# Patient Record
Sex: Male | Born: 1975
Health system: Southern US, Community
[De-identification: ages and names within clinical notes are randomized; demographics above are authoritative.]

---

## 2013-05-22 ENCOUNTER — Emergency Department: Payer: Self-pay | Admitting: Emergency Medicine

## 2013-09-07 ENCOUNTER — Emergency Department: Payer: Self-pay | Admitting: Emergency Medicine

## 2013-10-05 ENCOUNTER — Emergency Department: Payer: Self-pay | Admitting: Emergency Medicine

## 2013-10-13 ENCOUNTER — Ambulatory Visit: Payer: Self-pay | Admitting: Orthopedic Surgery

## 2013-10-19 ENCOUNTER — Emergency Department: Payer: Self-pay | Admitting: Emergency Medicine

## 2013-10-19 LAB — CBC WITH DIFFERENTIAL/PLATELET
BASOS PCT: 0.9 %
Basophil #: 0.1 10*3/uL (ref 0.0–0.1)
EOS PCT: 1.2 %
Eosinophil #: 0.1 10*3/uL (ref 0.0–0.7)
HCT: 46.9 % (ref 40.0–52.0)
HGB: 15.8 g/dL (ref 13.0–18.0)
LYMPHS PCT: 23.6 %
Lymphocyte #: 1.8 10*3/uL (ref 1.0–3.6)
MCH: 30.4 pg (ref 26.0–34.0)
MCHC: 33.6 g/dL (ref 32.0–36.0)
MCV: 90 fL (ref 80–100)
Monocyte #: 0.4 x10 3/mm (ref 0.2–1.0)
Monocyte %: 5.3 %
NEUTROS ABS: 5.2 10*3/uL (ref 1.4–6.5)
NEUTROS PCT: 69 %
PLATELETS: 233 10*3/uL (ref 150–440)
RBC: 5.19 10*6/uL (ref 4.40–5.90)
RDW: 13.3 % (ref 11.5–14.5)
WBC: 7.6 10*3/uL (ref 3.8–10.6)

## 2013-10-19 LAB — BASIC METABOLIC PANEL
Anion Gap: 7 (ref 7–16)
BUN: 13 mg/dL (ref 7–18)
CALCIUM: 9.5 mg/dL (ref 8.5–10.1)
CO2: 23 mmol/L (ref 21–32)
CREATININE: 0.73 mg/dL (ref 0.60–1.30)
Chloride: 107 mmol/L (ref 98–107)
EGFR (African American): >60
EGFR (Non-African Amer.): >60
GLUCOSE: 95 mg/dL (ref 65–99)
Osmolality: 274 (ref 275–301)
POTASSIUM: 3.7 mmol/L (ref 3.5–5.1)
SODIUM: 137 mmol/L (ref 136–145)

## 2013-10-19 LAB — TROPONIN I

## 2013-10-30 ENCOUNTER — Ambulatory Visit: Payer: Self-pay | Admitting: Unknown Physician Specialty

## 2013-11-27 DIAGNOSIS — Z9889 Other specified postprocedural states: Secondary | ICD-10-CM | POA: Insufficient documentation

## 2013-12-25 ENCOUNTER — Ambulatory Visit: Payer: Self-pay | Admitting: Unknown Physician Specialty

## 2014-01-08 ENCOUNTER — Ambulatory Visit: Payer: Self-pay | Admitting: Unknown Physician Specialty

## 2014-01-14 DIAGNOSIS — Z9889 Other specified postprocedural states: Secondary | ICD-10-CM | POA: Insufficient documentation

## 2014-04-06 DIAGNOSIS — M659 Synovitis and tenosynovitis, unspecified: Secondary | ICD-10-CM | POA: Insufficient documentation

## 2014-05-26 ENCOUNTER — Emergency Department: Payer: Self-pay | Admitting: Emergency Medicine

## 2014-07-15 ENCOUNTER — Emergency Department: Payer: Self-pay | Admitting: Emergency Medicine

## 2014-07-29 DIAGNOSIS — F1120 Opioid dependence, uncomplicated: Secondary | ICD-10-CM | POA: Insufficient documentation

## 2014-08-26 DIAGNOSIS — M25561 Pain in right knee: Secondary | ICD-10-CM | POA: Insufficient documentation

## 2014-11-10 NOTE — Discharge Instructions (Signed)

## 2014-11-12 ENCOUNTER — Ambulatory Visit: Admission: RE | Admit: 2014-11-12 | Payer: Self-pay | Source: Ambulatory Visit | Admitting: Unknown Physician Specialty

## 2014-11-12 ENCOUNTER — Encounter: Admission: RE | Payer: Self-pay | Source: Ambulatory Visit

## 2014-11-12 SURGERY — ARTHROSCOPY, KNEE
Anesthesia: Choice | Laterality: Right

## 2014-12-24 ENCOUNTER — Encounter: Admission: RE | Payer: Self-pay | Source: Ambulatory Visit

## 2014-12-24 ENCOUNTER — Ambulatory Visit: Admission: RE | Admit: 2014-12-24 | Payer: Self-pay | Source: Ambulatory Visit | Admitting: Unknown Physician Specialty

## 2014-12-24 SURGERY — ARTHROSCOPY, KNEE
Anesthesia: Choice | Laterality: Right

## 2016-03-16 ENCOUNTER — Emergency Department: Payer: Self-pay

## 2016-03-16 ENCOUNTER — Emergency Department
Admission: EM | Admit: 2016-03-16 | Discharge: 2016-03-16 | Disposition: A | Discharge status: Home / Self Care | Payer: Self-pay | Attending: Student in an Organized Health Care Education/Training Program | Admitting: Student in an Organized Health Care Education/Training Program

## 2016-03-16 ENCOUNTER — Encounter: Payer: Self-pay | Admitting: Emergency Medicine

## 2016-03-16 DIAGNOSIS — N281 Cyst of kidney, acquired: Secondary | ICD-10-CM | POA: Insufficient documentation

## 2016-03-16 DIAGNOSIS — F17200 Nicotine dependence, unspecified, uncomplicated: Secondary | ICD-10-CM | POA: Insufficient documentation

## 2016-03-16 LAB — URINALYSIS COMPLETE WITH MICROSCOPIC (ARMC ONLY)
BACTERIA UA: NONE SEEN
Bilirubin Urine: NEGATIVE
Glucose, UA: NEGATIVE mg/dL
Ketones, ur: NEGATIVE mg/dL
Leukocytes, UA: NEGATIVE
Nitrite: NEGATIVE
PROTEIN: NEGATIVE mg/dL
Specific Gravity, Urine: 1.017 (ref 1.005–1.030)
WBC UA: NONE SEEN WBC/hpf (ref 0–5)
pH: 7 (ref 5.0–8.0)

## 2016-03-16 MED ORDER — HYDROCODONE-ACETAMINOPHEN 5-325 MG PO TABS: 1.0000 | ORAL_TABLET | ORAL | 0 refills | Status: DC | PRN

## 2016-03-16 MED ORDER — SULFAMETHOXAZOLE-TRIMETHOPRIM 800-160 MG PO TABS: 1.0000 | ORAL_TABLET | Freq: Two times a day (BID) | ORAL | 0 refills | Status: DC

## 2016-03-16 MED ORDER — IBUPROFEN 800 MG PO TABS: 800.0000 mg | ORAL_TABLET | Freq: Three times a day (TID) | ORAL | 0 refills | Status: DC | PRN

## 2016-03-16 NOTE — ED Provider Notes (Signed)
[facility] Emergency Department Provider Note  ____________________________________________  Time seen: Approximately 8:27 AM  I have reviewed the triage vital signs and the nursing notes.   HISTORY  Chief Complaint Dysuria    HPI Gabriel Bullock is a 40 y.o. male presents for evaluation of burning pain upon urination. She reports episodes started prior to arrival earlier this morning. Complains of severe burning. Denies any discharge at all. Denies new sexual partners.   History reviewed. No pertinent past medical history.  There are no active problems to display for this patient.   History reviewed. No pertinent surgical history.  Prior to Admission medications   Medication Sig Start Date End Date Taking? Authorizing Provider  HYDROcodone-acetaminophen (NORCO) 5-325 MG tablet Take 1-2 tablets by mouth every 4 (four) hours as needed for moderate pain. 03/16/16   Pierce Crane Audreyana Huntsberry, PA-C  ibuprofen (ADVIL,MOTRIN) 800 MG tablet Take 1 tablet (800 mg total) by mouth every 8 (eight) hours as needed. 03/16/16   Pierce Crane Jariyah Hackley, PA-C  sulfamethoxazole-trimethoprim (BACTRIM DS,SEPTRA DS) 800-160 MG tablet Take 1 tablet by mouth 2 (two) times daily. 03/16/16   Arlyss Repress, PA-C    Allergies Review of patient's allergies indicates no known allergies.  No family history on file.  Social History Social History  Substance Use Topics  . Smoking status: Current Every Day Smoker  . Smokeless tobacco: Never Used  . Alcohol use No    Review of Systems Constitutional: No fever/chills Cardiovascular: Denies chest pain. Respiratory: Denies shortness of breath. Gastrointestinal: No abdominal pain.  No nausea, no vomiting.  No diarrhea.  No constipation. Genitourinary: Positive for dysuria Musculoskeletal: Negative for back pain. Skin: Negative for rash. Neurological: Negative for headaches, focal weakness or numbness.  10-point ROS otherwise  negative.  ____________________________________________   PHYSICAL EXAM:  VITAL SIGNS: ED Triage Vitals [03/16/16 0815]  Enc Vitals Group     BP (!) 146/90     Pulse Rate 71     Resp 18     Temp 97.9 F (36.6 C)     Temp Source Oral     SpO2 99 %     Weight 180 lb (81.6 kg)     Height 6\' 3"  (1.905 m)     Head Circumference      Peak Flow      Pain Score 10     Pain Loc      Pain Edu?      Excl. in [place]?     Constitutional: Alert and oriented. Well appearing and in no acute distress. Gastrointestinal: Soft and nontender. No distention.  No CVA tenderness. Musculoskeletal: No lower extremity tenderness nor edema.  No joint effusions. Neurologic:  Normal speech and language. No gross focal neurologic deficits are appreciated. No gait instability. Skin:  Skin is warm, dry and intact. No rash noted. Psychiatric: Mood and affect are normal. Speech and behavior are normal.  ____________________________________________   LABS (all labs ordered are listed, but only abnormal results are displayed)  Labs Reviewed  URINALYSIS COMPLETEWITH MICROSCOPIC ([place]) - Abnormal; Notable for the following:       Result Value   Color, Urine YELLOW (*)    APPearance HAZY (*)    Hgb urine dipstick 3+ (*)    Squamous Epithelial / LPF 0-5 (*)    All other components within normal limits   ____________________________________________   PROCEDURES  Procedure(s) performed: None   IMPRESSION:  Disc herniation a tLEFT L5-S1 questionably exerting mass  effect on  the LEFT L5 and S1 nerve roots, correlation for symptoms in these  distributions recommended ; if further assessment is required  recommend MR imaging of the lumbar spine.    BILATERAL renal cysts, with 2 cysts in the LEFT kidney complicated  by wall calcifications and septations, incompletely characterized;  followup MR imaging with and without contrast recommended to  characterize these complicated renal cysts.    No  acute intra-abdominal or intrapelvic abnormalities otherwise  identified.    Critical Care performed: No  ____________________________________________   INITIAL IMPRESSION / ASSESSMENT AND PLAN / ED COURSE  Pertinent labs & imaging results that were available during my care of the patient were reviewed by me and considered in my medical decision making (see chart for details). Review of the [place] CSRS was performed in accordance of the [place] prior to dispensing any controlled drugs.  Renal cyst. Rx given for Bactrim DS twice a day, ibuprofen 800 mg 3 times a day and encouraged the use of Tylenol over-the-counter. Patient was also referred to urology for follow-up.  Clinical Course    ____________________________________________   FINAL CLINICAL IMPRESSION(S) / ED DIAGNOSES  Final diagnoses:  Renal cyst     This chart was dictated using voice recognition software/Dragon. Despite best efforts to proofread, errors can occur which can change the meaning. Any change was purely unintentional.    Arlyss Repress, PA-C 03/16/16 0945    Merlyn Lot, MD 03/16/16 [phone]

## 2016-03-16 NOTE — ED Notes (Signed)
Patient reports waking up at 3am to urinate having pain and burning with urination. Pt states the urine had a dark color to it. Denies any other sxs at this time. Denies any penile discharge as well.  Pt states he has never had a urinary tract infection before.

## 2016-03-16 NOTE — ED Triage Notes (Signed)
Pt presents with urinary burning and pain started this am.

## 2016-03-19 ENCOUNTER — Ambulatory Visit (INDEPENDENT_AMBULATORY_CARE_PROVIDER_SITE_OTHER): Payer: Self-pay | Admitting: Urology

## 2016-03-19 ENCOUNTER — Encounter: Payer: Self-pay | Admitting: Urology

## 2016-03-19 DIAGNOSIS — N281 Cyst of kidney, acquired: Secondary | ICD-10-CM

## 2016-03-19 DIAGNOSIS — D4112 Neoplasm of uncertain behavior of left renal pelvis: Secondary | ICD-10-CM

## 2016-03-19 DIAGNOSIS — R310 Gross hematuria: Secondary | ICD-10-CM

## 2016-03-19 DIAGNOSIS — Q6100 Congenital renal cyst, unspecified: Secondary | ICD-10-CM

## 2016-03-19 LAB — MICROSCOPIC EXAMINATION: RBC, UA: >30 /hpf — AB (ref 0–?)

## 2016-03-19 LAB — URINALYSIS, COMPLETE
Bilirubin, UA: NEGATIVE
GLUCOSE, UA: NEGATIVE
KETONES UA: NEGATIVE
LEUKOCYTES UA: NEGATIVE
NITRITE UA: NEGATIVE
Urobilinogen, Ur: 0.2 mg/dL (ref 0.2–1.0)
pH, UA: 5.5 (ref 5.0–7.5)

## 2016-03-19 NOTE — Progress Notes (Signed)
03/19/2016 6:26 AM   Gabriel Bullock [birth year] [id number]  Referring provider: No referring provider defined for this encounter.  No chief complaint on file.   HPI:  1 - Gross Hematuria - new gross hematuria 03/2016. 20PY smoker.  CT stone with bilateral renal cysts and left mass as per below. Cr 0.73 by ER labs 03/2016.   2 - Left Renal Cystic Neoplasms - Left upper pole 6cm cystic mass and lower pole 4cm cystic masses with wall calcificaitons and nodules by CT stone. This si in background of less complex bilateral cysts as per below.  3 - Bilateral Renal Cysts - Rt upper 3cm, Rt lower 3.8cm, Rt mid 1cm (all non-complex) and Left mid periplevic (non-complex) plus two more left complex.   PMH sig for spine disc disease, bilateral knee scope. No CV disease / blood thinners. He is single father his wife having died recently around 02/11/16.   Today "Gabriel Bullock" is seen as new patient for above. He is referred by ER.     PMH: No past medical history on file.  Surgical History: No past surgical history on file.  Home Medications:    Medication List       Accurate as of 03/19/16  6:26 AM. Always use your most recent med list.          HYDROcodone-acetaminophen 5-325 MG tablet Commonly known as:  NORCO Take 1-2 tablets by mouth every 4 (four) hours as needed for moderate pain.   ibuprofen 800 MG tablet Commonly known as:  ADVIL,MOTRIN Take 1 tablet (800 mg total) by mouth every 8 (eight) hours as needed.   sulfamethoxazole-trimethoprim 800-160 MG tablet Commonly known as:  BACTRIM DS,SEPTRA DS Take 1 tablet by mouth 2 (two) times daily.       Allergies: No Known Allergies  Family History: No family history on file.  Social History:  reports that he has been smoking.  He has never used smokeless tobacco. He reports that he does not drink alcohol. His drug history is not on file.    Review of Systems  Gastrointestinal (upper)  : Negative for upper GI  symptoms  Gastrointestinal (lower) : Negative for lower GI symptoms  Constitutional : Negative for symptoms  Skin: Negative for skin symptoms  Eyes: Negative for eye symptoms  Ear/Nose/Throat : Negative for Ear/Nose/Throat symptoms  Hematologic/Lymphatic: Negative for Hematologic/Lymphatic symptoms  Cardiovascular : Negative for cardiovascular symptoms  Respiratory : Negative for respiratory symptoms  Endocrine: Negative for endocrine symptoms  Musculoskeletal: Negative for musculoskeletal symptoms  Neurological: Negative for neurological symptoms  Psychologic: Negative for psychiatric symptoms   Physical Exam: There were no vitals taken for this visit.  Constitutional:  Alert and oriented, No acute distress. HEENT: [place] AT, moist mucus membranes.  Trachea midline, no masses. Cardiovascular: No clubbing, cyanosis, or edema. Respiratory: Normal respiratory effort, no increased work of breathing. GI: Abdomen is soft, nontender, nondistended, no abdominal masses GU: No CVA tenderness. Strraght phallus with testes down w/o masses. Circ'd.  Skin: No rashes, bruises or suspicious lesions. Lymph: No cervical or inguinal adenopathy. Neurologic: Grossly intact, no focal deficits, moving all 4 extremities. Psychiatric: Normal mood and affect.  Laboratory Data: Lab Results  Component Value Date   WBC 7.6 10/19/2013   HGB 15.8 10/19/2013   HCT 46.9 10/19/2013   MCV 90 10/19/2013   PLT 233 10/19/2013    Lab Results  Component Value Date   CREATININE 0.73 10/19/2013     Urinalysis    Component  Value Date/Time   COLORURINE YELLOW (A) 03/16/2016 0832   APPEARANCEUR HAZY (A) 03/16/2016 0832   LABSPEC 1.017 03/16/2016 0832   PHURINE 7.0 03/16/2016 0832   GLUCOSEU NEGATIVE 03/16/2016 0832   HGBUR 3+ (A) 03/16/2016 0832   BILIRUBINUR NEGATIVE 03/16/2016 0832   KETONESUR NEGATIVE 03/16/2016 0832   PROTEINUR NEGATIVE 03/16/2016 0832   NITRITE NEGATIVE  03/16/2016 0832   LEUKOCYTESUR NEGATIVE 03/16/2016 0832    Pertinent Imaging: As per above  Assessment & Plan:    1 - Gross Hematuria - MR Urogram and cysto on return to complete eval. DDX benign and malignant etiologies discssed. Left masses quite concernign as per below.   2 - Left Renal Cystic Neoplasms - Unusual at his age, but concerning. Rec MR Urogram (with and without contrast abd-pelvis) to further characterize. Possibility of malignancy discussed.   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 Fortunatley GFR is excellent. Imaging as per above and will need at least yearly surveillance with imaging and GFR measure even in imaging lowers suspiciosn for cancer. May also consider future nephrology eval especially if any sig GFR decline.   4 - RTC with MRI for cysto.      Alexis Frock, [facility] [address], [place], [place] [address] [phone]

## 2016-04-12 ENCOUNTER — Ambulatory Visit
Admission: RE | Admit: 2016-04-12 | Discharge: 2016-04-12 | Disposition: A | Discharge status: Home / Self Care | Payer: Self-pay | Source: Ambulatory Visit | Attending: Urology | Admitting: Urology

## 2016-04-12 DIAGNOSIS — N281 Cyst of kidney, acquired: Secondary | ICD-10-CM | POA: Insufficient documentation

## 2016-04-12 DIAGNOSIS — N289 Disorder of kidney and ureter, unspecified: Secondary | ICD-10-CM | POA: Insufficient documentation

## 2016-04-12 DIAGNOSIS — R310 Gross hematuria: Secondary | ICD-10-CM | POA: Insufficient documentation

## 2016-04-12 DIAGNOSIS — D4112 Neoplasm of uncertain behavior of left renal pelvis: Secondary | ICD-10-CM

## 2016-04-12 MED ORDER — GADOBENATE DIMEGLUMINE 529 MG/ML IV SOLN
20.0000 mL | Freq: Once | INTRAVENOUS | Status: AC | PRN
  Administered 2016-04-12: 16 mL via INTRAVENOUS

## 2016-04-17 ENCOUNTER — Other Ambulatory Visit: Payer: Self-pay | Admitting: Urology

## 2016-04-17 ENCOUNTER — Encounter: Payer: Self-pay | Admitting: Urology

## 2016-05-15 ENCOUNTER — Other Ambulatory Visit: Payer: Self-pay | Admitting: Urology

## 2016-05-15 NOTE — Progress Notes (Deleted)
05/15/2016 12:54 PM   Gabriel Bullock [birth year] [id number]  Referring provider: No referring provider defined for this encounter.  No chief complaint on file.   HPI:  1 - Gross Hematuria - new gross hematuria 03/2016. 20PY smoker.  CT stone with bilateral renal cysts and left mass as per below. Dedicated MRI abd 2017 w/o additional findings. Cr 0.73 by ER labs 03/2016. Cysto 05/2016 XXXX.Marland Kitchen   2 - Left Renal Cystic Neoplasms - Left upper pole 6cm cystic mass and lower pole 4cm cystic masses with wall calcificaitons and nodules by CT stone. This si in background of less complex bilateral cysts as per below. Dedicated MRI 05/2016 [place] 5F as some septations, but no nodular enhancement.   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 GFR excellent.   PMH sig for spine disc disease, bilateral knee scope. No CV disease / blood thinners. He is single father his wife having died recently around March 26, 2016.   Today "Gabriel Bullock" is seen for cysto and f/u above to complete hematuria eval. Interval MRI very favorable w/o worry for obvious upper tract tumors.     PMH: No past medical history on file.  Surgical History: No past surgical history on file.  Home Medications:    Medication List       Accurate as of 05/15/16 12:54 PM. Always use your most recent med list.          HYDROcodone-acetaminophen 5-325 MG tablet Commonly known as:  NORCO Take 1-2 tablets by mouth every 4 (four) hours as needed for moderate pain.   ibuprofen 800 MG tablet Commonly known as:  ADVIL,MOTRIN Take 1 tablet (800 mg total) by mouth every 8 (eight) hours as needed.   sulfamethoxazole-trimethoprim 800-160 MG tablet Commonly known as:  BACTRIM DS,SEPTRA DS Take 1 tablet by mouth 2 (two) times daily.       Allergies: No Known Allergies  Family History: No family  history on file.  Social History:  reports that he has been smoking.  He has been smoking about 0.75 packs per day. He has never used smokeless tobacco. He reports that he does not drink alcohol or use drugs.    Review of Systems  Gastrointestinal (upper)  : Negative for upper GI symptoms  Gastrointestinal (lower) : Negative for lower GI symptoms  Constitutional : Negative for symptoms  Skin: Negative for skin symptoms  Eyes: Negative for eye symptoms  Ear/Nose/Throat : Negative for Ear/Nose/Throat symptoms  Hematologic/Lymphatic: Negative for Hematologic/Lymphatic symptoms  Cardiovascular : Negative for cardiovascular symptoms  Respiratory : Negative for respiratory symptoms  Endocrine: Negative for endocrine symptoms  Musculoskeletal: Negative for musculoskeletal symptoms  Neurological: Negative for neurological symptoms  Psychologic: Negative for psychiatric symptoms   Physical Exam: There were no vitals taken for this visit.  Constitutional:  Alert and oriented, No acute distress. HEENT: [place] AT, moist mucus membranes.  Trachea midline, no masses. Cardiovascular: No clubbing, cyanosis, or edema. Respiratory: Normal respiratory effort, no increased work of breathing. GI: Abdomen is soft, nontender, nondistended, no abdominal masses GU: No CVA tenderness. Strraght phallus with testes down w/o masses. Circ'd.  Skin: No rashes, bruises or suspicious lesions. Lymph: No cervical or inguinal adenopathy. Neurologic: Grossly intact, no focal deficits, moving all 4 extremities. Psychiatric: Normal mood and affect.  Laboratory Data: Lab Results  Component Value Date   WBC  7.6 10/19/2013   HGB 15.8 10/19/2013   HCT 46.9 10/19/2013   MCV 90 10/19/2013   PLT 233 10/19/2013    Lab Results  Component Value Date   CREATININE 0.73 10/19/2013     Urinalysis    Component Value Date/Time   COLORURINE YELLOW (A) 03/16/2016 0832   APPEARANCEUR Cloudy (A)  03/19/2016 0859   LABSPEC 1.017 03/16/2016 0832   PHURINE 7.0 03/16/2016 0832   GLUCOSEU Negative 03/19/2016 0859   HGBUR 3+ (A) 03/16/2016 0832   BILIRUBINUR Negative 03/19/2016 0859   KETONESUR NEGATIVE 03/16/2016 0832   PROTEINUR Trace (A) 03/19/2016 0859   PROTEINUR NEGATIVE 03/16/2016 0832   NITRITE Negative 03/19/2016 0859   NITRITE NEGATIVE 03/16/2016 0832   LEUKOCYTESUR Negative 03/19/2016 0859    Pertinent Imaging: As per above  Assessment & Plan:    1 - Gross Hematuria - eval with labs, exam, imaging, cysto w/o worrisome etiologies. This is good news. Will surveil annually x several given mildly complex cysts.   2 - Left Renal Cystic Neoplasms - MRI favorable, but does require surveillance. Rec alternate CT, MRI, [place] going forward x several years to verify stability given young age.   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 Fortunatley GFR is excellent. Imaging as per above and will need at least yearly surveillance with imaging and GFR measure even in imaging lowers suspiciosn for cancer. May also consider future nephrology eval especially if any sig GFR decline.   4 - RTC 1 year with BMP and CT renal with/without.     Alexis Frock, [facility] [address], [place], [place] [address] [phone]

## 2016-05-28 ENCOUNTER — Inpatient Hospital Stay: Payer: Self-pay | Admitting: Anesthesiology

## 2016-05-28 ENCOUNTER — Emergency Department
Admission: EM | Admit: 2016-05-28 | Discharge: 2016-05-28 | Discharge status: Against Medical Advice | Payer: Self-pay | Attending: Emergency Medicine | Admitting: Emergency Medicine

## 2016-05-28 ENCOUNTER — Emergency Department: Payer: Self-pay

## 2016-05-28 ENCOUNTER — Ambulatory Visit: Admit: 2016-05-28 | Payer: Self-pay | Admitting: Surgery

## 2016-05-28 ENCOUNTER — Encounter
Admission: EM | Disposition: A | Discharge status: Home / Self Care | Payer: Self-pay | Source: Home / Self Care | Attending: Surgery

## 2016-05-28 ENCOUNTER — Inpatient Hospital Stay
Admission: EM | Admit: 2016-05-28 | Discharge: 2016-05-30 | DRG: 603 | Disposition: A | Discharge status: Home / Self Care | Payer: Self-pay | Attending: Surgery | Admitting: Surgery

## 2016-05-28 DIAGNOSIS — F17210 Nicotine dependence, cigarettes, uncomplicated: Secondary | ICD-10-CM | POA: Diagnosis present

## 2016-05-28 DIAGNOSIS — Z23 Encounter for immunization: Secondary | ICD-10-CM

## 2016-05-28 DIAGNOSIS — L03113 Cellulitis of right upper limb: Secondary | ICD-10-CM | POA: Diagnosis present

## 2016-05-28 DIAGNOSIS — I808 Phlebitis and thrombophlebitis of other sites: Secondary | ICD-10-CM | POA: Diagnosis present

## 2016-05-28 DIAGNOSIS — L02419 Cutaneous abscess of limb, unspecified: Secondary | ICD-10-CM

## 2016-05-28 DIAGNOSIS — L0291 Cutaneous abscess, unspecified: Secondary | ICD-10-CM

## 2016-05-28 DIAGNOSIS — L02413 Cutaneous abscess of right upper limb: Secondary | ICD-10-CM | POA: Insufficient documentation

## 2016-05-28 DIAGNOSIS — F17200 Nicotine dependence, unspecified, uncomplicated: Secondary | ICD-10-CM | POA: Insufficient documentation

## 2016-05-28 HISTORY — PX: INCISION AND DRAINAGE ABSCESS: SHX5864

## 2016-05-28 LAB — CBC WITH DIFFERENTIAL/PLATELET
BASOS ABS: 0.1 10*3/uL (ref 0–0.1)
Basophils Relative: 1 %
EOS ABS: 0.1 10*3/uL (ref 0–0.7)
EOS PCT: 1 %
HCT: 46.1 % (ref 40.0–52.0)
HEMOGLOBIN: 15.8 g/dL (ref 13.0–18.0)
LYMPHS ABS: 3.6 10*3/uL (ref 1.0–3.6)
LYMPHS PCT: 25 %
MCH: 30.5 pg (ref 26.0–34.0)
MCHC: 34.3 g/dL (ref 32.0–36.0)
MCV: 88.7 fL (ref 80.0–100.0)
Monocytes Absolute: 1.1 10*3/uL — ABNORMAL HIGH (ref 0.2–1.0)
Monocytes Relative: 8 %
NEUTROS PCT: 65 %
Neutro Abs: 9.6 10*3/uL — ABNORMAL HIGH (ref 1.4–6.5)
PLATELETS: 217 10*3/uL (ref 150–440)
RBC: 5.19 MIL/uL (ref 4.40–5.90)
RDW: 13.2 % (ref 11.5–14.5)
WBC: 14.5 10*3/uL — AB (ref 3.8–10.6)

## 2016-05-28 LAB — BASIC METABOLIC PANEL
Anion gap: 9 (ref 5–15)
BUN: 14 mg/dL (ref 6–20)
CHLORIDE: 98 mmol/L — AB (ref 101–111)
CO2: 28 mmol/L (ref 22–32)
CREATININE: 0.79 mg/dL (ref 0.61–1.24)
Calcium: 9.3 mg/dL (ref 8.9–10.3)
Glucose, Bld: 90 mg/dL (ref 65–99)
POTASSIUM: 3.9 mmol/L (ref 3.5–5.1)
SODIUM: 135 mmol/L (ref 135–145)

## 2016-05-28 LAB — LACTIC ACID, PLASMA: LACTIC ACID, VENOUS: 1.1 mmol/L (ref 0.5–1.9)

## 2016-05-28 SURGERY — INCISION AND DRAINAGE, ABSCESS
Anesthesia: General | Site: Arm Upper | Laterality: Right | Wound class: Dirty or Infected

## 2016-05-28 MED ORDER — VANCOMYCIN HCL IN DEXTROSE 1-5 GM/200ML-% IV SOLN
1000.0000 mg | Freq: Once | INTRAVENOUS | Status: AC
  Administered 2016-05-28: 1000 mg via INTRAVENOUS
  Filled 2016-05-28: qty 200

## 2016-05-28 MED ORDER — FENTANYL CITRATE (PF) 100 MCG/2ML IJ SOLN
INTRAMUSCULAR | Status: AC
  Administered 2016-05-28: 25 ug via INTRAVENOUS
  Filled 2016-05-28: qty 2

## 2016-05-28 MED ORDER — KETOROLAC TROMETHAMINE 30 MG/ML IJ SOLN
30.0000 mg | Freq: Once | INTRAMUSCULAR | Status: AC
  Administered 2016-05-28: 30 mg via INTRAVENOUS
  Filled 2016-05-28: qty 1

## 2016-05-28 MED ORDER — OXYCODONE-ACETAMINOPHEN 5-325 MG PO TABS
1.0000 | ORAL_TABLET | ORAL | Status: DC | PRN
  Filled 2016-05-28: qty 1

## 2016-05-28 MED ORDER — PIPERACILLIN-TAZOBACTAM 3.375 G IVPB 30 MIN
3.3750 g | Freq: Once | INTRAVENOUS | Status: AC
  Administered 2016-05-28: 3.375 g via INTRAVENOUS
  Filled 2016-05-28: qty 50

## 2016-05-28 MED ORDER — OXYCODONE HCL 5 MG PO TABS
5.0000 mg | ORAL_TABLET | ORAL | Status: DC | PRN
  Administered 2016-05-29 (×2): 5 mg via ORAL
  Administered 2016-05-30 (×2): 10 mg via ORAL
  Filled 2016-05-28: qty 1
  Filled 2016-05-28: qty 2
  Filled 2016-05-28: qty 1
  Filled 2016-05-28: qty 2

## 2016-05-28 MED ORDER — VANCOMYCIN HCL IN DEXTROSE 1-5 GM/200ML-% IV SOLN
1000.0000 mg | Freq: Three times a day (TID) | INTRAVENOUS | Status: DC
  Administered 2016-05-29 – 2016-05-30 (×5): 1000 mg via INTRAVENOUS
  Filled 2016-05-28 (×7): qty 200

## 2016-05-28 MED ORDER — ACETAMINOPHEN 500 MG PO TABS
1000.0000 mg | ORAL_TABLET | Freq: Four times a day (QID) | ORAL | Status: DC
  Administered 2016-05-28 – 2016-05-30 (×7): 1000 mg via ORAL
  Filled 2016-05-28 (×7): qty 2

## 2016-05-28 MED ORDER — INFLUENZA VAC SPLIT QUAD 0.5 ML IM SUSY
0.5000 mL | PREFILLED_SYRINGE | INTRAMUSCULAR | Status: AC
  Administered 2016-05-29: 0.5 mL via INTRAMUSCULAR
  Filled 2016-05-28: qty 0.5

## 2016-05-28 MED ORDER — ONDANSETRON HCL 4 MG/2ML IJ SOLN: 4.0000 mg | Freq: Four times a day (QID) | INTRAMUSCULAR | Status: DC | PRN

## 2016-05-28 MED ORDER — ONDANSETRON 4 MG PO TBDP: 4.0000 mg | ORAL_TABLET | Freq: Four times a day (QID) | ORAL | Status: DC | PRN

## 2016-05-28 MED ORDER — SUCCINYLCHOLINE CHLORIDE 20 MG/ML IJ SOLN
INTRAMUSCULAR | Status: DC | PRN
  Administered 2016-05-28: 100 mg via INTRAVENOUS

## 2016-05-28 MED ORDER — HYDROMORPHONE HCL 1 MG/ML IJ SOLN: 0.5000 mg | INTRAMUSCULAR | Status: DC | PRN

## 2016-05-28 MED ORDER — PROPOFOL 10 MG/ML IV BOLUS
INTRAVENOUS | Status: DC | PRN
  Administered 2016-05-28: 200 mg via INTRAVENOUS

## 2016-05-28 MED ORDER — FENTANYL CITRATE (PF) 100 MCG/2ML IJ SOLN
INTRAMUSCULAR | Status: DC | PRN
  Administered 2016-05-28: 100 ug via INTRAVENOUS

## 2016-05-28 MED ORDER — MIDAZOLAM HCL 2 MG/2ML IJ SOLN
INTRAMUSCULAR | Status: DC | PRN
  Administered 2016-05-28: 2 mg via INTRAVENOUS

## 2016-05-28 MED ORDER — POLYETHYLENE GLYCOL 3350 17 G PO PACK: 17.0000 g | PACK | Freq: Every day | ORAL | Status: DC | PRN

## 2016-05-28 MED ORDER — LACTATED RINGERS IV SOLN
125.0000 mL/h | INTRAVENOUS | Status: DC
  Administered 2016-05-29 – 2016-05-30 (×4): 125 mL/h via INTRAVENOUS

## 2016-05-28 MED ORDER — LACTATED RINGERS IV SOLN
INTRAVENOUS | Status: DC | PRN
  Administered 2016-05-28: 21:00:00 via INTRAVENOUS

## 2016-05-28 MED ORDER — VANCOMYCIN HCL IN DEXTROSE 1-5 GM/200ML-% IV SOLN
1000.0000 mg | Freq: Once | INTRAVENOUS | Status: DC
  Filled 2016-05-28: qty 200

## 2016-05-28 MED ORDER — ENOXAPARIN SODIUM 40 MG/0.4ML ~~LOC~~ SOLN
40.0000 mg | SUBCUTANEOUS | Status: DC
  Administered 2016-05-29: 40 mg via SUBCUTANEOUS
  Filled 2016-05-28: qty 0.4

## 2016-05-28 MED ORDER — KETOROLAC TROMETHAMINE 30 MG/ML IJ SOLN
30.0000 mg | Freq: Four times a day (QID) | INTRAMUSCULAR | Status: DC
  Administered 2016-05-28 – 2016-05-30 (×7): 30 mg via INTRAVENOUS
  Filled 2016-05-28 (×7): qty 1

## 2016-05-28 MED ORDER — PIPERACILLIN-TAZOBACTAM 3.375 G IVPB
3.3750 g | Freq: Three times a day (TID) | INTRAVENOUS | Status: DC
  Administered 2016-05-29 – 2016-05-30 (×4): 3.375 g via INTRAVENOUS
  Filled 2016-05-28 (×4): qty 50

## 2016-05-28 MED ORDER — ONDANSETRON HCL 4 MG/2ML IJ SOLN: 4.0000 mg | Freq: Once | INTRAMUSCULAR | Status: DC | PRN

## 2016-05-28 MED ORDER — LIDOCAINE HCL (CARDIAC) 20 MG/ML IV SOLN
INTRAVENOUS | Status: DC | PRN
  Administered 2016-05-28: 50 mg via INTRAVENOUS

## 2016-05-28 MED ORDER — VANCOMYCIN HCL IN DEXTROSE 1-5 GM/200ML-% IV SOLN: 1000.0000 mg | Freq: Once | INTRAVENOUS | Status: DC

## 2016-05-28 MED ORDER — FENTANYL CITRATE (PF) 100 MCG/2ML IJ SOLN
25.0000 ug | INTRAMUSCULAR | Status: DC | PRN
  Administered 2016-05-28 (×2): 25 ug via INTRAVENOUS

## 2016-05-28 SURGICAL SUPPLY — 15 items
CANISTER SUCT 1200ML W/VALVE (MISCELLANEOUS) ×3 IMPLANT
DRAIN PENROSE 1/4X12 LTX (DRAIN) ×3 IMPLANT
DRAPE EXTREMITY 106X87X128.5 (DRAPES) ×3 IMPLANT
ELECT REM PT RETURN 9FT ADLT (ELECTROSURGICAL) ×3
ELECTRODE REM PT RTRN 9FT ADLT (ELECTROSURGICAL) ×1 IMPLANT
GLOVE BIO SURGEON STRL SZ7 (GLOVE) ×9 IMPLANT
GLOVE BIOGEL PI IND STRL 7.0 (GLOVE) ×2 IMPLANT
GLOVE BIOGEL PI INDICATOR 7.0 (GLOVE) ×4
GOWN STRL REUS W/ TWL LRG LVL3 (GOWN DISPOSABLE) ×2 IMPLANT
GOWN STRL REUS W/TWL LRG LVL3 (GOWN DISPOSABLE) ×4
KIT RM TURNOVER STRD PROC AR (KITS) ×3 IMPLANT
PACK BASIN MINOR ARMC (MISCELLANEOUS) ×3 IMPLANT
SUT ETHILON 3 0 PS 1 (SUTURE) ×3 IMPLANT
SWAB CULTURE AMIES ANAERIB BLU (MISCELLANEOUS) ×3 IMPLANT
SYRINGE 10CC LL (SYRINGE) ×3 IMPLANT

## 2016-05-28 NOTE — Progress Notes (Signed)
Pharmacy Antibiotic Note  Gabriel Bullock is a 40 y.o. male admitted on 05/28/2016 with abscess on right arm.  Pharmacy has been consulted for vancomycin and piperacillin/tazobactam dosing.  Plan: Piperacillin/tazobactam 3.375 g IV q8h EI  Vancomycin 1000 mg IV q8h (6 hour stacked dosing) Goal vancomycin trough 15-20 mcg/mL Vancomycin trough ordered for 11/29 at 0830  Kinetics: Using actual body weight = 79 kg Half-life: 7.9 hours Ke: 0.087 Vd: 55L Cmin ~14 mcg/mL estimated  Height: 6\' 3"  (190.5 cm) Weight: 175 lb (79.4 kg) IBW/kg (Calculated) : 84.5  Temp (24hrs), Avg:99.4 F (37.4 C), Min:98.4 F (36.9 C), Max:100.4 F (38 C)   Recent Labs Lab 05/28/16 1231  WBC 14.5*  CREATININE 0.79    Estimated Creatinine Clearance: 140.6 mL/min (by C-G formula based on SCr of 0.79 mg/dL).    No Known Allergies  Antimicrobials this admission: vancomycin 11/27 >>  Piperacillin/tazobactam 11/27 >>   Dose adjustments this admission:  Microbiology results: 11/27 BCx: Sent  Thank you for allowing pharmacy to be a part of this patient's care.  Lenis Noon, PharmD, BCPS Clinical Pharmacist 05/28/2016 7:13 PM

## 2016-05-28 NOTE — ED Provider Notes (Signed)
[facility] Emergency Department Provider Note  ____________________________________________  Time seen: Approximately 11:49 AM  I have reviewed the triage vital signs and the nursing notes.   HISTORY  Chief Complaint Arm Pain    HPI Gabriel Bullock is a 40 y.o. male, NAD, who presents to the emergency department for a 5 day history of right arm pain, redness and swelling. Patient reports that last Wednesday he went to the [facility] department for a mandatory blood draw. He reports that every ten years he has to have blood work done for charges against him. Since Wednesday he has pain that has worsened and is associated with progressive swelling and redness that began on Friday.He denies any trauma to the area aside from the blood draw and denies IV drug use. He reports he has not tried to open or cut into the area and denies any draining or bleeding. He denies fever, chills, abdominal pain, nausea, vomiting, numbness or tingling in the right upper extremity, cough, chest pain or shortness of breath.    No past medical history on file.  Patient Active Problem List   Diagnosis Date Noted  . Gross hematuria 03/19/2016  . 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 Renal cyst 03/19/2016    No past surgical history on file.  Prior to Admission medications   Medication Sig Start Date End Date Taking? Authorizing Provider  HYDROcodone-acetaminophen (NORCO) 5-325 MG tablet Take 1-2 tablets by mouth every 4 (four) hours as needed for moderate pain. Patient not taking: Reported on 03/19/2016 03/16/16   Pierce Crane Beers, PA-C  ibuprofen (ADVIL,MOTRIN) 800 MG tablet Take 1 tablet (800 mg total) by mouth every 8 (eight) hours as needed. 03/16/16   Pierce Crane Beers, PA-C  sulfamethoxazole-trimethoprim (BACTRIM DS,SEPTRA DS) 800-160 MG tablet Take 1 tablet by mouth 2 (two) times daily. 03/16/16   Arlyss Repress, PA-C    Allergies Patient has no known  allergies.  No family history on file.  Social History Social History  Substance Use Topics  . Smoking status: Current Every Day Smoker    Packs/day: 0.75  . Smokeless tobacco: Never Used  . Alcohol use No     Review of Systems  Constitutional: No fever/chills Cardiovascular: No chest pain. Respiratory: No shortness of breath. No wheezing.  Gastrointestinal: No abdominal pain.  No nausea, vomiting.   Musculoskeletal: Positive for pain at right [facility] fossa Skin: Positive for swelling and redness at the right AC fossa. Negative for rash, skin sores, bruising Neurological: No numbness, tingling, or loss of sensation to the right upper extremity.  10-point ROS otherwise negative.  ____________________________________________   PHYSICAL EXAM:  VITAL SIGNS: ED Triage Vitals  Enc Vitals Group     BP 05/28/16 1131 139/77     Pulse Rate 05/28/16 1131 95     Resp 05/28/16 1131 17     Temp 05/28/16 1131 98.4 F (36.9 C)     Temp Source 05/28/16 1131 Oral     SpO2 05/28/16 1131 98 %     Weight 05/28/16 1134 175 lb (79.4 kg)     Height 05/28/16 1134 6\' 3"  (1.905 m)     Head Circumference --      Peak Flow --      Pain Score 05/28/16 1135 9     Pain Loc --      Pain Edu? --      Excl. in [place]? --     Constitutional: Alert and  oriented. Well appearing and in no acute distress. Eyes: Conjunctivae are normal without icterus or injection.  Head: Atraumatic. Neck: Supple with full range of motion Hematological/Lymphatic/Immunilogical: No cervical lymphadenopathy. Cardiovascular: Normal rate, regular rhythm. Normal S1 and S2.  Good peripheral circulation with 2+ distal pulses in the right upper extremity. Capillary refill is brisk in all digits of the right hand Respiratory: Normal respiratory effort without tachypnea or retractions.  Musculoskeletal: Tenderness to palpation of the right upper extremity at [facility] fossa with induration. Range of motion decreased about the right elbow due to  pain. No tenderness to palpation about the humerus, olecranon or posterior forearm. Grip strength is 5 out of 5 and equal in bilateral hands. Neurologic:  Normal speech and language. No gross focal neurologic deficits are appreciated.  Skin:  Large baseball size area of cellulitis with underlying  induration to the right AC fossa surrounding erythema. Skin at area is warm to touch as compared to proximal or distal upper extremity. No streaking or oozing noted from the area. No entry wound visualized.  Psychiatric: Mood and affect are normal. Speech and behavior are normal. Patient exhibits appropriate insight and judgement.   ____________________________________________   LABS (all labs ordered are listed, but only abnormal results are displayed)  Labs Reviewed  BASIC METABOLIC PANEL - Abnormal; Notable for the following:       Result Value   Chloride 98 (*)    All other components within normal limits  CBC WITH DIFFERENTIAL/PLATELET - Abnormal; Notable for the following:    WBC 14.5 (*)    Neutro Abs 9.6 (*)    Monocytes Absolute 1.1 (*)    All other components within normal limits   ____________________________________________  EKG  None ____________________________________________  RADIOLOGY I, Judithe Modest Twylla Arceneaux, personally viewed and evaluated these images (plain radiographs) as part of my medical decision making, as well as reviewing the written report by the radiologist.  [place] Extrem Up Right [employer]  Result Date: 05/28/2016 CLINICAL DATA:  Pain, swelling and erythema in the antecubital fossa for 3 days. EXAM: ULTRASOUND right UPPER EXTREMITY LIMITED TECHNIQUE: Ultrasound examination of the upper extremity soft tissues was performed in the area of clinical concern. COMPARISON:  None FINDINGS: Complex fluid collection in the antecubital fossa measuring 3.8 x 1.6 cm is suspicious for an abscess. There is also diffuse cellulitis. Superficial thrombophlebitis noted in a branch of the  cephalic vein. IMPRESSION: Complex fluid collection in the antecubital fossa is suspicious for an abscess. Recommend MR imaging without and with contrast for further evaluation. Superficial thrombophlebitis. Diffuse cellulitis. Electronically Signed   By: Marijo Sanes M.D.   On: 05/28/2016 13:29    ____________________________________________    PROCEDURES  Procedure(s) performed: None   Procedures   Medications  vancomycin (VANCOCIN) IVPB 1000 mg/200 mL premix (1,000 mg Intravenous Not Given 05/28/16 1448)  ketorolac (TORADOL) 30 MG/ML injection 30 mg (30 mg Intravenous Given 05/28/16 1316)     ____________________________________________   INITIAL IMPRESSION / ASSESSMENT AND PLAN / ED COURSE  Pertinent labs & imaging results that were available during my care of the patient were reviewed by me and considered in my medical decision making (see chart for details).  Clinical Course as of May 28 1502  Mon May 28, 2016  1450 Patient notified nursing staff that he would be unable to stay any longer for IV antibiotics nor admission due to needing to make arrangements for child care. Patient notes he may return later tonight to complete evaluation and  treatment. Dr. Lavonia Drafts and Dr. Hampton Abbot has been notified of such. Dr. Hampton Abbot states that Dr. Clayburn Pert will be on call for general surgery after 7 PM and should be contacted if the patient shows up after that time.  [JH]    Clinical Course User Index [JH] Tallie Dodds L Kase Shughart, PA-C    Patient's diagnosis is consistent with abscess of the antecubital fossa. Please see above as patient left AMA.     ____________________________________________  FINAL CLINICAL IMPRESSION(S) / ED DIAGNOSES  Final diagnoses:  Abscess of antecubital fossa      NEW MEDICATIONS STARTED DURING THIS VISIT:  Discharge Medication List as of 05/28/2016  2:52 PM          Braxton Feathers, PA-C 05/28/16 1503    Lavonia Drafts, MD 05/28/16  [phone]

## 2016-05-28 NOTE — ED Provider Notes (Signed)
[facility] Emergency Department Provider Note   ____________________________________________    I have reviewed the triage vital signs and the nursing notes.   HISTORY  Chief Complaint Wound Infection     HPI Gabriel Bullock is a 40 y.o. male who was seen by me earlier today. He had to leave and pick up his daughter from school. He returns now for treatment. Continues to complain of throbbing  Moderate pain and swelling in right arm. He attributes this to [facility] [place] [facility]. Feels ill.    History reviewed. No pertinent past medical history.  Patient Active Problem List   Diagnosis Date Noted  . Gross hematuria 03/19/2016  . 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 Renal cyst 03/19/2016    History reviewed. No pertinent surgical history.  Prior to Admission medications   Not on File     Allergies Patient has no known allergies.  No family history on file.  Social History Social History  Substance Use Topics  . Smoking status: Current Every Day Smoker    Packs/day: 0.75  . Smokeless tobacco: Never Used  . Alcohol use No    Review of Systems  Constitutional: subjective fever Eyes: No visual changes.   Cardiovascular: Denies chest pain. Respiratory: Denies shortness of breath. Gastrointestinal: No abdominal pain.  No nausea, no vomiting.    Musculoskeletal:right arm pain as above Skin: rash to the right arm Neurological: Negative for headaches  10-point ROS otherwise negative.  ____________________________________________   PHYSICAL EXAM:  VITAL SIGNS: ED Triage Vitals [05/28/16 1732]  Enc Vitals Group     BP 133/82     Pulse Rate (!) 105     Resp 18     Temp (!) 100.4 F (38 C)     Temp Source Oral     SpO2 99 %     Weight 175 lb (79.4 kg)     Height 6\' 3"  (1.905 m)     Head Circumference      Peak Flow      Pain Score 8     Pain Loc      Pain Edu?      Excl. in  [place]?     Constitutional: Alert and oriented. No acute distress.  Eyes: Conjunctivae are normal.   Nose: No congestion/rhinnorhea. Mouth/Throat: Mucous membranes are moist.    Cardiovascular: Normal rate, regular rhythm. Grossly normal heart sounds.  Good peripheral circulation. Respiratory: Normal respiratory effort.  No retractions. Lungs CTAB. Gastrointestinal: Soft and nontender. No distention.  No CVA tenderness. Genitourinary: deferred Musculoskeletal: significant swelling and induration of right AC with surrounding cellulitis, no fluctuance to delineate specific location of abscess. 2+distal pulses Neurologic:  Normal speech and language. No gross focal neurologic deficits are appreciated.  Skin:  Skin is warm, dry and intact. As above Psychiatric: Mood and affect are normal. Speech and behavior are normal.  ____________________________________________   LABS (all labs ordered are listed, but only abnormal results are displayed)  Labs Reviewed  CULTURE, BLOOD (ROUTINE X 2)  CULTURE, BLOOD (ROUTINE X 2)  LACTIC ACID, PLASMA  LACTIC ACID, PLASMA   ____________________________________________  EKG   ____________________________________________  RADIOLOGY  US performed earlier today ____________________________________________   PROCEDURES  Procedure(s) performed: No    Critical Care performed: No ____________________________________________   INITIAL IMPRESSION / ASSESSMENT AND PLAN / ED COURSE  Pertinent labs & imaging results that were available during my care of the patient  were reviewed by me and considered in my medical decision making (see chart for details).  Contacted surgery for admission/drainage. Vanc/zosyn and sepsis protocol ordered.  Clinical Course    ____________________________________________   FINAL CLINICAL IMPRESSION(S) / ED DIAGNOSES  Final diagnoses:  Cellulitis of right upper extremity  Abscess      NEW MEDICATIONS  STARTED DURING THIS VISIT:  New Prescriptions   No medications on file     Note:  This document was prepared using Dragon voice recognition software and may include unintentional dictation errors.    Lavonia Drafts, MD 05/28/16 [phone]

## 2016-05-28 NOTE — ED Triage Notes (Signed)
Pt arrives with right sided arm pain  Pt reports that he had a DNA test last week and he has started experiencing pain at the site  Edema and redness noted to his right  [facility]

## 2016-05-28 NOTE — Anesthesia Procedure Notes (Signed)
Performed by: Jmari Pelc       

## 2016-05-28 NOTE — Anesthesia Procedure Notes (Signed)
Procedure Name: Intubation Date/Time: 05/28/2016 8:52 PM Performed by: Lendon Colonel Pre-anesthesia Checklist: Patient identified, Emergency Drugs available, Suction available, Patient being monitored and Timeout performed Patient Re-evaluated:Patient Re-evaluated prior to inductionOxygen Delivery Method: Circle system utilized Preoxygenation: Pre-oxygenation with 100% oxygen Intubation Type: IV induction, Rapid sequence and Cricoid Pressure applied Laryngoscope Size: Miller and 2 Grade View: Grade I Number of attempts: 1 Airway Equipment and Method: Stylet Placement Confirmation: ETT inserted through vocal cords under direct vision,  positive ETCO2 and breath sounds checked- equal and bilateral Secured at: 21 cm Tube secured with: Tape Dental Injury: Teeth and Oropharynx as per pre-operative assessment

## 2016-05-28 NOTE — Transfer of Care (Signed)
Immediate Anesthesia Transfer of Care Note  Patient: Gabriel Bullock  Procedure(s) Performed: Procedure(s): INCISION AND DRAINAGE ABSCESS (Right)  Patient Location: PACU  Anesthesia Type:General  Level of Consciousness: sedated and patient cooperative  Airway & Oxygen Therapy: Patient Spontanous Breathing and Patient connected to face mask oxygen  Post-op Assessment: Report given to RN and Post -op Vital signs reviewed and stable  Post vital signs: Reviewed and stable  Last Vitals:  Vitals:   05/28/16 2016 05/28/16 2129  BP: 123/73   Pulse: (!) 102 (!) 113  Resp: 18 (!) 24  Temp: 37.5 C (!) 38.6 C    Last Pain:  Vitals:   05/28/16 1800  TempSrc:   PainSc: 8          Complications: No apparent anesthesia complications

## 2016-05-28 NOTE — ED Triage Notes (Signed)
Pt had to leave earlier before admission to make child care arrangements.. Pt is to be admitted for infection of the right arm,.

## 2016-05-28 NOTE — Anesthesia Preprocedure Evaluation (Addendum)
Anesthesia Evaluation  Patient identified by MRN, date of birth, ID band Patient awake    Reviewed: Allergy & Precautions, NPO status , Patient's Chart, lab work & pertinent test results  Airway Mallampati: II  TM Distance: >3 FB     Dental  (+) Poor Dentition, Missing, Chipped   Pulmonary Current Smoker,    Pulmonary exam normal        Cardiovascular negative cardio ROS Normal cardiovascular exam     Neuro/Psych negative neurological ROS  negative psych ROS   GI/Hepatic negative GI ROS, Neg liver ROS,   Endo/Other  negative endocrine ROS  Renal/GU Renal cyst and history of hematuria     Musculoskeletal abcess of arm   Abdominal Normal abdominal exam  (+)   Peds negative pediatric ROS (+)  Hematology negative hematology ROS (+)   Anesthesia Other Findings History reviewed. No pertinent past medical history.  Reproductive/Obstetrics                            Anesthesia Physical Anesthesia Plan  ASA: II and emergent  Anesthesia Plan: General   Post-op Pain Management:    Induction: Intravenous, Rapid sequence and Cricoid pressure planned  Airway Management Planned: Oral ETT  Additional Equipment:   Intra-op Plan:   Post-operative Plan: Extubation in OR  Informed Consent: I have reviewed the patients History and Physical, chart, labs and discussed the procedure including the risks, benefits and alternatives for the proposed anesthesia with the patient or authorized representative who has indicated his/her understanding and acceptance.   Dental advisory given  Plan Discussed with: CRNA and Surgeon  Anesthesia Plan Comments:         Anesthesia Quick Evaluation

## 2016-05-28 NOTE — Op Note (Signed)
   Pre-operative Diagnosis: Right upper extremity abscess  Post-operative Diagnosis: Same  Procedure performed: Incision and drainage of right upper extremity abscess  Surgeon: Clayburn Pert   Assistants: None  Anesthesia: General LMA anesthesia  ASA Class: 2  Surgeon: Clayburn Pert, MD FACS  Anesthesia: Gen. with endotracheal tube  Assistant: None  Procedure Details  The patient was seen again in the Holding Room. The benefits, complications, treatment options, and expected outcomes were discussed with the patient. The risks of bleeding, infection, recurrence of symptoms, failure to resolve symptoms, any of which could require further surgery were reviewed with the patient.   The patient was taken to Operating Room, identified as Gabriel Bullock and the procedure verified.  A Time Out was held and the above information confirmed.  Prior to the induction of general anesthesia, antibiotic prophylaxis was administered. VTE prophylaxis was in place. General anesthesia was then administered and tolerated well. After the induction, the right upper extremity was prepped with Chloraprep and draped in the sterile fashion. The patient was positioned in the supine position.  The area of maximal induration was accessed with 18-gauge needle and syringe. Thick purulent fluid was immediately returned. A cruciate incision with an 11 blade scalpel was made over this just proximal to the antecubital fossa and medial to the biceps. Copious purulent fluid was returned. A counterincision was then made approximately 3 cm proximal to this in the same line medial to the biceps. Purulent fluid was returned through both incisions. These were then irrigated with normal saline until the irrigant returned clear. The skin incisions were made hemostatic with directed electrocautery. A quarter-inch Penrose drain was placed through both incisions to hold the I&D open. The Penrose was secured with a 3-0 nylon suture on  both ends and a dressing of plain gauze and ABDs pad was placed over this held in place with Kerlix gauze.  Patient tolerated procedure well. He was woken from general anesthesia without any, complications. All counts are correct at the end of the procedure and there were no immediate Complications.  Findings: Right upper extremity abscess   Estimated Blood Loss: 10 mL         Drains: Penrose drain         Specimens: Culture of the abscess          Complications: None                  Condition: Good   Clayburn Pert, MD, FACS

## 2016-05-28 NOTE — H&P (Signed)
Date of Admission:  05/28/2016  Reason for Admission:  Right arm abscess  History of Present Illness: CASS RILL is a 40 y.o. male who presents with a four-day history of right arm pain that has resulted in an abscess. Patient reports that he started having some pain in his right arm around the elbow area on 11/24 at nighttime. Over the past few days this has progressively gotten worse and this morning the patient reported that when he woke up he was not able to make a full fist with his right hand. He has noticed significant swelling distal and proximal to the elbow as well as significant cellulitis as well. He presented today to emergency room and had an ultrasound of the right upper extremity showing a complex abscess measuring 3.8 x 1.6 cm at the before meals fossa with some associated superficial thrombophlebitis of a branch of the cephalic vein. He had to leave AMA because he needed to pick up his daughter from school and set up someone to look after her while he was in the hospital. He returns now for further evaluation and reports that he had a low-grade temperature of 100.4. He has a white blood cell count of 14.5.  He denies using any IV drugs and reports that on 11/23 he had a blood draw at the [facility] office which is done once every 10 years.   Past Medical History: No prior abscesses.   Past Surgical History: Bilateral knee arthroscopy  Home Medications: Prior to Admission medications   None     Allergies: No Known Allergies  Social History:  reports that he has been smoking.  He has been smoking about 0.75 packs per day. He has never used smokeless tobacco. He reports that he does not drink alcohol or use drugs.   Family History: Reviewed and noncontributory.  Review of Systems: Review of Systems  Constitutional: Positive for fever. Negative for chills.  HENT: Negative for hearing loss.   Eyes: Negative for blurred vision.  Respiratory: Negative for cough and  shortness of breath.   Cardiovascular: Negative for chest pain and leg swelling.  Gastrointestinal: Negative for abdominal pain, heartburn, nausea and vomiting.  Genitourinary: Negative for dysuria and hematuria.  Musculoskeletal:       Reports unable to make full fist with right hand this morning.  Skin: Negative for rash.  Neurological: Negative for tingling and sensory change.  Psychiatric/Behavioral: Negative for depression.  All other systems reviewed and are negative.   Physical Exam BP 133/82 (BP Location: Left Arm)   Pulse (!) 105   Temp (!) 100.4 F (38 C) (Oral)   Resp 18   Ht 6\' 3"  (1.905 m)   Wt 79.4 kg (175 lb)   SpO2 99%   BMI 21.87 kg/m  CONSTITUTIONAL: No acute distress HEENT:  Normocephalic, atraumatic, extraocular motion intact. NECK: Trachea is midline, and there is no jugular venous distension. RESPIRATORY:  Lungs are clear, and breath sounds are equal bilaterally. Normal respiratory effort without pathologic use of accessory muscles. CARDIOVASCULAR: Heart is regular without murmurs, gallops, or rubs. GI: The abdomen is soft, nondistended, nontender.  MUSCULOSKELETAL:  Normal muscle strength and tone in all four extremities.   SKIN: Right upper extremity has significant edema surrounding the before meals fossa extending distally approximately about 10 cm each way. There is significant cellulitis in this region as well but no areas of fluctuance. No active drainage no needle marks or tracks. NEUROLOGIC:  Motor and sensation is grossly normal.  Cranial nerves are grossly intact. PSYCH:  Alert and oriented to person, place and time. Affect is normal.  Laboratory Analysis: Results for orders placed or performed during the hospital encounter of 05/28/16 (from the past 24 hour(s))  Basic metabolic panel     Status: Abnormal   Collection Time: 05/28/16 12:31 PM  Result Value Ref Range   Sodium 135 135 - 145 mmol/L   Potassium 3.9 3.5 - 5.1 mmol/L   Chloride 98 (L)  101 - 111 mmol/L   CO2 28 22 - 32 mmol/L   Glucose, Bld 90 65 - 99 mg/dL   BUN 14 6 - 20 mg/dL   Creatinine, Ser 0.79 0.61 - 1.24 mg/dL   Calcium 9.3 8.9 - 10.3 mg/dL   GFR calc non Af Amer >60 >60 mL/min   GFR calc Af Amer >60 >60 mL/min   Anion gap 9 5 - 15  CBC with Differential     Status: Abnormal   Collection Time: 05/28/16 12:31 PM  Result Value Ref Range   WBC 14.5 (H) 3.8 - 10.6 K/uL   RBC 5.19 4.40 - 5.90 MIL/uL   Hemoglobin 15.8 13.0 - 18.0 g/dL   HCT 46.1 40.0 - 52.0 %   MCV 88.7 80.0 - 100.0 fL   MCH 30.5 26.0 - 34.0 pg   MCHC 34.3 32.0 - 36.0 g/dL   RDW 13.2 11.5 - 14.5 %   Platelets 217 150 - 440 K/uL   Neutrophils Relative % 65 %   Neutro Abs 9.6 (H) 1.4 - 6.5 K/uL   Lymphocytes Relative 25 %   Lymphs Abs 3.6 1.0 - 3.6 K/uL   Monocytes Relative 8 %   Monocytes Absolute 1.1 (H) 0.2 - 1.0 K/uL   Eosinophils Relative 1 %   Eosinophils Absolute 0.1 0 - 0.7 K/uL   Basophils Relative 1 %   Basophils Absolute 0.1 0 - 0.1 K/uL    Imaging: [place] Extrem Up Right Ltd  Result Date: 05/28/2016 CLINICAL DATA:  Pain, swelling and erythema in the antecubital fossa for 3 days. EXAM: ULTRASOUND right UPPER EXTREMITY LIMITED TECHNIQUE: Ultrasound examination of the upper extremity soft tissues was performed in the area of clinical concern. COMPARISON:  None FINDINGS: Complex fluid collection in the antecubital fossa measuring 3.8 x 1.6 cm is suspicious for an abscess. There is also diffuse cellulitis. Superficial thrombophlebitis noted in a branch of the cephalic vein. IMPRESSION: Complex fluid collection in the antecubital fossa is suspicious for an abscess. Recommend MR imaging without and with contrast for further evaluation. Superficial thrombophlebitis. Diffuse cellulitis. Electronically Signed   By: Marijo Sanes M.D.   On: 05/28/2016 13:29    Assessment and Plan: This is a 40 y.o. male who presents with A right arm antecubital fossa abscess. He will be taken to the operating  room tonight for incision and drainage. The risks and benefits of the procedure have been discussed with the patient including bleeding and injury to surrounding structures and the patient's willing to proceed. He will be started on IV antibiotics in the emergency room and will have urine tox screen as well.  Patient understands this plan and discussions have been answered.   Melvyn Neth, [place]

## 2016-05-28 NOTE — Brief Op Note (Signed)
05/28/2016  9:24 PM  PATIENT:  Gabriel Bullock  40 y.o. male  PRE-OPERATIVE DIAGNOSIS:  Abscess  POST-OPERATIVE DIAGNOSIS:  Abscess  PROCEDURE:  Procedure(s): INCISION AND DRAINAGE ABSCESS (Right)  SURGEON:  Surgeon(s) and Role:       * Clayburn Pert, MD  PHYSICIAN ASSISTANT:   ASSISTANTS: none   ANESTHESIA:   general  EBL:  Total I/O In: 30 [IV Piggyback:50] Out: -   BLOOD ADMINISTERED:none  DRAINS: Penrose drain in the right upper extremity abscess cavity   LOCAL MEDICATIONS USED:  NONE  SPECIMEN:  No Specimen  DISPOSITION OF SPECIMEN:  N/A  COUNTS:  YES  TOURNIQUET:  * No tourniquets in log *  DICTATION: .Dragon Dictation  PLAN OF CARE: Admit for overnight observation  PATIENT DISPOSITION:  PACU - hemodynamically stable.   Delay start of Pharmacological VTE agent (>24hrs) due to surgical blood loss or risk of bleeding: no

## 2016-05-28 NOTE — ED Notes (Signed)
Pt states he has to leave and pick up his daughter from school and make arrangements for her care before he will be able to come back for admission and treatment.. IV removed PTA, intact.

## 2016-05-29 ENCOUNTER — Encounter: Payer: Self-pay | Admitting: Surgery

## 2016-05-29 LAB — URINE DRUG SCREEN, QUALITATIVE (ARMC ONLY)
Amphetamines, Ur Screen: NOT DETECTED
BARBITURATES, UR SCREEN: NOT DETECTED
BENZODIAZEPINE, UR SCRN: POSITIVE — AB
CANNABINOID 50 NG, UR ~~LOC~~: NOT DETECTED
Cocaine Metabolite,Ur ~~LOC~~: POSITIVE — AB
MDMA (Ecstasy)Ur Screen: NOT DETECTED
Methadone Scn, Ur: NOT DETECTED
OPIATE, UR SCREEN: NOT DETECTED
PHENCYCLIDINE (PCP) UR S: NOT DETECTED
Tricyclic, Ur Screen: NOT DETECTED

## 2016-05-29 LAB — BASIC METABOLIC PANEL
Anion gap: 6 (ref 5–15)
BUN: 15 mg/dL (ref 6–20)
CALCIUM: 8.6 mg/dL — AB (ref 8.9–10.3)
CHLORIDE: 102 mmol/L (ref 101–111)
CO2: 26 mmol/L (ref 22–32)
CREATININE: 0.84 mg/dL (ref 0.61–1.24)
GFR calc Af Amer: >60 mL/min (ref >60)
GFR calc non Af Amer: >60 mL/min (ref >60)
Glucose, Bld: 108 mg/dL — ABNORMAL HIGH (ref 65–99)
Potassium: 3.8 mmol/L (ref 3.5–5.1)
SODIUM: 134 mmol/L — AB (ref 135–145)

## 2016-05-29 LAB — MAGNESIUM: MAGNESIUM: 1.9 mg/dL (ref 1.7–2.4)

## 2016-05-29 LAB — CBC
HEMATOCRIT: 40.8 % (ref 40.0–52.0)
Hemoglobin: 13.9 g/dL (ref 13.0–18.0)
MCH: 30.1 pg (ref 26.0–34.0)
MCHC: 34.1 g/dL (ref 32.0–36.0)
MCV: 88.2 fL (ref 80.0–100.0)
Platelets: 162 10*3/uL (ref 150–440)
RBC: 4.63 MIL/uL (ref 4.40–5.90)
RDW: 13.1 % (ref 11.5–14.5)
WBC: 15.4 10*3/uL — ABNORMAL HIGH (ref 3.8–10.6)

## 2016-05-29 MED ORDER — NICOTINE POLACRILEX 2 MG MT GUM
2.0000 mg | CHEWING_GUM | OROMUCOSAL | Status: DC | PRN
  Administered 2016-05-29: 2 mg via ORAL
  Filled 2016-05-29: qty 1

## 2016-05-29 NOTE — Progress Notes (Signed)
05/29/2016  Subjective: Patient is 1 Day Post-Op status post incision and drainage of right arm abscess. No acute events overnight. Patient reports some improvement with this pain and no neurologic deficits on his right upper extremity.  Vital signs: Temp:  [98.3 F (36.8 C)-101.5 F (38.6 C)] 98.3 F (36.8 C) (11/28 1215) Pulse Rate:  [66-113] 66 (11/28 1215) Resp:  [12-24] 16 (11/28 1215) BP: (101-136)/(53-88) 101/55 (11/28 1215) SpO2:  [94 %-100 %] 98 % (11/28 1215) Weight:  [79.4 kg (175 lb)] 79.4 kg (175 lb) (11/27 1732)   Intake/Output: 11/27 0701 - 11/28 0700 In: 1025 [I.V.:975; IV Piggyback:50] Out: 55 [Urine:50; Blood:5] Last BM Date: 05/28/16  Physical Exam: Constitutional: No acute distress Extremities: Right upper extremity has gauze and Kerlix dressing which is clean dry and intact. Swelling has decreased there is less erythema.  Labs:   Recent Labs  05/28/16 1231 05/29/16 0406  WBC 14.5* 15.4*  HGB 15.8 13.9  HCT 46.1 40.8  PLT 217 162    Recent Labs  05/28/16 1231 05/29/16 0406  NA 135 134*  K 3.9 3.8  CL 98* 102  CO2 28 26  GLUCOSE 90 108*  BUN 14 15  CREATININE 0.79 0.84  CALCIUM 9.3 8.6*   No results for input(s): LABPROT, INR in the last 72 hours.  Imaging: [place] Extrem Up Right Ltd  Result Date: 05/28/2016 CLINICAL DATA:  Pain, swelling and erythema in the antecubital fossa for 3 days. EXAM: ULTRASOUND right UPPER EXTREMITY LIMITED TECHNIQUE: Ultrasound examination of the upper extremity soft tissues was performed in the area of clinical concern. COMPARISON:  None FINDINGS: Complex fluid collection in the antecubital fossa measuring 3.8 x 1.6 cm is suspicious for an abscess. There is also diffuse cellulitis. Superficial thrombophlebitis noted in a branch of the cephalic vein. IMPRESSION: Complex fluid collection in the antecubital fossa is suspicious for an abscess. Recommend MR imaging without and with contrast for further evaluation.  Superficial thrombophlebitis. Diffuse cellulitis. Electronically Signed   By: Marijo Sanes M.D.   On: 05/28/2016 13:29    Assessment/Plan: 40 year old male status post incision and drainage of right arm abscess.  -Advance diet to regular and discontinue IV fluids. -Continue IV antibiotics. -Await OR cultures so antibiotics can be tailored down to oral versions.   Melvyn Neth, [place]

## 2016-05-30 LAB — CBC WITH DIFFERENTIAL/PLATELET
BASOS PCT: 1 %
Basophils Absolute: 0.1 10*3/uL (ref 0–0.1)
EOS ABS: 0.2 10*3/uL (ref 0–0.7)
EOS PCT: 3 %
HCT: 38.5 % — ABNORMAL LOW (ref 40.0–52.0)
HEMOGLOBIN: 13.1 g/dL (ref 13.0–18.0)
LYMPHS ABS: 1.3 10*3/uL (ref 1.0–3.6)
Lymphocytes Relative: 17 %
MCH: 30.1 pg (ref 26.0–34.0)
MCHC: 33.9 g/dL (ref 32.0–36.0)
MCV: 88.9 fL (ref 80.0–100.0)
MONOS PCT: 7 %
Monocytes Absolute: 0.5 10*3/uL (ref 0.2–1.0)
NEUTROS PCT: 72 %
Neutro Abs: 5.8 10*3/uL (ref 1.4–6.5)
PLATELETS: 150 10*3/uL (ref 150–440)
RBC: 4.33 MIL/uL — ABNORMAL LOW (ref 4.40–5.90)
RDW: 13.4 % (ref 11.5–14.5)
WBC: 8 10*3/uL (ref 3.8–10.6)

## 2016-05-30 LAB — VANCOMYCIN, TROUGH: VANCOMYCIN TR: 18 ug/mL (ref 15–20)

## 2016-05-30 MED ORDER — IBUPROFEN 600 MG PO TABS: 600.0000 mg | ORAL_TABLET | Freq: Three times a day (TID) | ORAL | 0 refills | Status: AC | PRN

## 2016-05-30 MED ORDER — SULFAMETHOXAZOLE-TRIMETHOPRIM 800-160 MG PO TABS: 1.0000 | ORAL_TABLET | Freq: Two times a day (BID) | ORAL | 0 refills | Status: AC

## 2016-05-30 MED ORDER — OXYCODONE HCL 5 MG PO TABS: 5.0000 mg | ORAL_TABLET | Freq: Four times a day (QID) | ORAL | 0 refills | Status: DC | PRN

## 2016-05-30 NOTE — Progress Notes (Signed)
05/30/2016  Subjective: Patient is 2 Days Post-Op status post incision and drainage of right upper extremity abscess. No acute events overnight. White blood cell count has normalized today. Describes that his pain is better and range of motion of the arm is better as well.  Vital signs: Temp:  [97.7 F (36.5 C)-98.3 F (36.8 C)] 97.7 F (36.5 C) (11/29 0508) Pulse Rate:  [66-76] 66 (11/29 0508) Resp:  [16-18] 18 (11/29 0508) BP: (101-127)/(55-77) 127/77 (11/29 0508) SpO2:  [98 %-99 %] 99 % (11/29 0508)   Intake/Output: 11/28 0701 - 11/29 0700 In: 4140.1 [P.O.:480; I.V.:2971.9; IV Piggyback:688.1] Out: -  Last BM Date: 05/29/16  Physical Exam: Constitutional: No acute distress Extremity: Right upper extremity wound with Penrose drain in place with no purulent drainage and only serosanguineous fluid on the gauze. No motor or sensory deficits with good grip strength and range of motion. No further erythema or cellulitis and much improved edema of the arm.  Labs:   Recent Labs  05/29/16 0406 05/30/16 0836  WBC 15.4* 8.0  HGB 13.9 13.1  HCT 40.8 38.5*  PLT 162 150    Recent Labs  05/28/16 1231 05/29/16 0406  NA 135 134*  K 3.9 3.8  CL 98* 102  CO2 28 26  GLUCOSE 90 108*  BUN 14 15  CREATININE 0.79 0.84  CALCIUM 9.3 8.6*   No results for input(s): LABPROT, INR in the last 72 hours.  Imaging: No results found.  Assessment/Plan: 40 year old male status post incision and drainage of right upper extremity abscess.  -Start daily dressing changes with dry gauze and Kerlix wrap leaving the Penrose drain in place. -We'll change IV antibiotics to oral Bactrim pending culture results. -Likely discharge to home today with follow-up with Dr. Adonis Huguenin in 1 week.   Melvyn Neth, [place]

## 2016-05-30 NOTE — Care Management (Signed)
Patient self pay patient.  To be discharged on Bactrim and oxycodone.  Out of pocket cost for both prescriptions $8.63.   Patient provided with application for [facility] and Medication management.

## 2016-05-30 NOTE — Progress Notes (Signed)
Patient discharge teaching given, including activity, diet, follow-up appoints, and medications. Patient verbalized understanding of all discharge instructions. IV access was d/c'd. Dressing change was taught to pt and pt verbalized and demonstrated on proper dressing techniques. Vitals are stable. Skin is intact except as charted in most recent assessments. Pt refused to be escorted out, to be driven home by brother.  Marijean Montanye [employer]

## 2016-05-30 NOTE — Discharge Summary (Signed)
Patient ID: Gabriel Bullock MRN: [id number] DOB/AGE: Jun 01, 1976 40 y.o.  Admit date: 05/28/2016 Discharge date: 05/30/2016   Discharge Diagnoses:  Active Problems:   Abscess of right arm   Procedures: Incision and drainage of right arm abscess  Hospital Course: Patient was admitted on 11/27 the right arm abscess. He was taken to the operating room that same day and underwent an incision and drainage procedure. Penrose drain was left in place to allow for better drainage of this cavity. He was on IV vancomycin and Zosyn. His diet was slowly advanced and was transitioned to oral pain medication. Currently cultures from the operating room are growing rare gram-positive cocci in cultures are pending. On exam he no longer has any cellulitis and induration and swelling are much improved. His pain is well-controlled. He will be discharged to home today  Consults: None  Disposition: Home, self-care  Discharge Instructions    Call MD for:  difficulty breathing, headache or visual disturbances    Complete by:  As directed    Call MD for:  persistant nausea and vomiting    Complete by:  As directed    Call MD for:  redness, tenderness, or signs of infection (pain, swelling, redness, odor or green/yellow discharge around incision site)    Complete by:  As directed    Call MD for:  severe uncontrolled pain    Complete by:  As directed    Call MD for:  temperature >100.4    Complete by:  As directed    Change dressing (specify)    Complete by:  As directed    Apply dry gauze over the wound and drain and wrap with Kerlix gauze once daily and as needed   Diet - low sodium heart healthy    Complete by:  As directed    Discharge instructions    Complete by:  As directed    Patient may shower but do not scrub wounds heavily and dab dry only. Do not submerge wound in pool or tub.   Driving Restrictions    Complete by:  As directed    Do not drive while taking narcotics for pain control.   Increase activity slowly    Complete by:  As directed    Lifting restrictions    Complete by:  As directed    No strenuous activity with the right upper extremity. Continue doing daily range of motion exercises with the right shoulder, elbow, and hand.       Medication List    TAKE these medications   ibuprofen 600 MG tablet Commonly known as:  ADVIL,MOTRIN Take 1 tablet (600 mg total) by mouth every 8 (eight) hours as needed for fever, mild pain or moderate pain.   oxyCODONE 5 MG immediate release tablet Commonly known as:  Oxy IR/ROXICODONE Take 1 tablet (5 mg total) by mouth every 6 (six) hours as needed for severe pain.   sulfamethoxazole-trimethoprim 800-160 MG tablet Commonly known as:  BACTRIM DS,SEPTRA DS Take 1 tablet by mouth 2 (two) times daily.      Follow-up Sayre, MD Follow up in 1 week(s).   Specialty:  General Surgery Contact information: [address] [place] [address] [phone]

## 2016-05-30 NOTE — Progress Notes (Signed)
Pharmacy Antibiotic Note  Gabriel Bullock is a 40 y.o. male admitted on 05/28/2016 with abscess on right arm.  Pharmacy has been consulted for vancomycin and piperacillin/tazobactam dosing.  Plan: Piperacillin/tazobactam 3.375 g IV q8h EI  Vancomycin trough level resulted @ 18. Will continue current Vancomycin regimen. Will recheck levels as indicated based on renal function changes.l Height: 6\' 3"  (190.5 cm) Weight: 175 lb (79.4 kg) IBW/kg (Calculated) : 84.5  Temp (24hrs), Avg:98 F (36.7 C), Min:97.7 F (36.5 C), Max:98.3 F (36.8 C)   Recent Labs Lab 05/28/16 1231 05/28/16 1930 05/29/16 0406 05/30/16 0836  WBC 14.5*  --  15.4* 8.0  CREATININE 0.79  --  0.84  --   LATICACIDVEN  --  1.1  --   --   VANCOTROUGH  --   --   --  18    Estimated Creatinine Clearance: 133.9 mL/min (by C-G formula based on SCr of 0.84 mg/dL).    No Known Allergies  Antimicrobials this admission: vancomycin 11/27 >>  Piperacillin/tazobactam 11/27 >>   Dose adjustments this admission:  Microbiology results: 11/27 BCx: Sent  Thank you for allowing pharmacy to be a part of this patient's care.  Larene Beach, PharmD  Clinical Pharmacist 05/30/2016 9:45 AM

## 2016-05-31 NOTE — Anesthesia Postprocedure Evaluation (Signed)
Anesthesia Post Note  Patient: Gabriel Bullock  Procedure(s) Performed: Procedure(s) (LRB): INCISION AND DRAINAGE ABSCESS (Right)  Patient location during evaluation: PACU Anesthesia Type: General Level of consciousness: awake and alert and oriented Pain management: pain level controlled Vital Signs Assessment: post-procedure vital signs reviewed and stable Respiratory status: spontaneous breathing Cardiovascular status: blood pressure returned to baseline Anesthetic complications: no    Last Vitals:  Vitals:   05/30/16 0508 05/30/16 1316  BP: 127/77 113/66  Pulse: 66 63  Resp: 18 18  Temp: 36.5 C 36.7 C    Last Pain:  Vitals:   05/30/16 1316  TempSrc: Oral  PainSc:                  Helma Argyle

## 2016-06-02 LAB — CULTURE, BLOOD (ROUTINE X 2)
CULTURE: NO GROWTH
CULTURE: NO GROWTH

## 2016-06-04 ENCOUNTER — Ambulatory Visit (INDEPENDENT_AMBULATORY_CARE_PROVIDER_SITE_OTHER): Payer: Self-pay | Admitting: General Surgery

## 2016-06-04 ENCOUNTER — Encounter: Payer: Self-pay | Admitting: General Surgery

## 2016-06-04 VITALS — BP 129/68 | HR 75 | Temp 98.2°F | Wt 180.0 lb

## 2016-06-04 DIAGNOSIS — B019 Varicella without complication: Secondary | ICD-10-CM | POA: Insufficient documentation

## 2016-06-04 DIAGNOSIS — Z4889 Encounter for other specified surgical aftercare: Secondary | ICD-10-CM

## 2016-06-04 LAB — AEROBIC/ANAEROBIC CULTURE W GRAM STAIN (SURGICAL/DEEP WOUND)

## 2016-06-04 MED ORDER — TRAMADOL HCL 50 MG PO TABS: 50.0000 mg | ORAL_TABLET | Freq: Four times a day (QID) | ORAL | 0 refills | Status: AC | PRN

## 2016-06-04 NOTE — Patient Instructions (Signed)
Please give [place] a call if you notice any signs of infection.  If you develop redness, drainage, or pain at incision sites- call our office immediately and speak with a nurse.

## 2016-06-04 NOTE — Progress Notes (Signed)
Outpatient Surgical Follow Up  06/04/2016  Gabriel Bullock is an 40 y.o. male.   Chief Complaint  Patient presents with  . Routine Post Op    INCISION AND DRAINAGE ABSCESS post op 11/27 Dr Adonis Huguenin    HPI: 40 year old male returns to clinic 1 week status post I&D of right upper extremity abscess. Patient reports doing well. Having some throbbing but it is improving. He is still on antibiotics but denies any fevers, chills, nausea, vomiting, chest pain, shortness of breath, diarrhea, constipation.  No past medical history on file.  Past Surgical History:  Procedure Laterality Date  . INCISION AND DRAINAGE ABSCESS Right 05/28/2016   Procedure: INCISION AND DRAINAGE ABSCESS;  Surgeon: Olean Ree, MD;  Location: [facility] ORS;  Service: General;  Laterality: Right;    No family history on file.  Social History:  reports that he has been smoking.  He has been smoking about 0.75 packs per day. He has never used smokeless tobacco. He reports that he does not drink alcohol or use drugs.  Allergies: No Known Allergies  Medications reviewed.    ROS A multipoint review of systems was completed, all pertinent positives and negatives are documented within the history of present illness and remainder are negative.   BP 129/68   Pulse 75   Temp 98.2 F (36.8 C) (Oral)   Wt 81.6 kg (180 lb)   BMI 22.50 kg/m   Physical Exam  Gen.: No acute distress Neck: Supple nontender Chest: Clear to auscultation Heart: Regular rhythm Abdomen: Soft and nontender Skin: Right upper extremity I&D site with Penrose drain still in place. No spreading erythema or purulent drainage.   No results found for this or any previous visit (from the past 48 hour(s)). No results found.  Assessment/Plan:  1. Aftercare following surgery 40 year old male status post I&D of right upper extremity abscess. Doing well. Penrose drain removed in clinic without any difficulty. Discussed timeline for healing and for  patient to call clinic in 2 weeks should he does not think it is healing appropriately or if it is worsening to call clinic immediately. He voiced understanding and will follow-up in clinic on an as-needed basis.     Clayburn Pert, MD FACS General Surgeon  06/04/2016,2:29 PM

## 2019-10-28 ENCOUNTER — Encounter: Payer: Self-pay | Admitting: *Deleted

## 2019-10-28 ENCOUNTER — Other Ambulatory Visit: Payer: Self-pay

## 2019-10-28 DIAGNOSIS — K130 Diseases of lips: Secondary | ICD-10-CM | POA: Insufficient documentation

## 2019-10-28 DIAGNOSIS — F17200 Nicotine dependence, unspecified, uncomplicated: Secondary | ICD-10-CM | POA: Insufficient documentation

## 2019-10-28 NOTE — ED Triage Notes (Signed)
Pt says that he noticed a bump on his upper lip, he was able to apply pressure and had some drainage, now having increased swelling in the upper lip since yesterday.

## 2019-10-29 ENCOUNTER — Emergency Department
Admission: EM | Admit: 2019-10-29 | Discharge: 2019-10-29 | Disposition: A | Discharge status: Home / Self Care | Payer: Self-pay | Attending: Emergency Medicine | Admitting: Emergency Medicine

## 2019-10-29 DIAGNOSIS — K130 Diseases of lips: Secondary | ICD-10-CM

## 2019-10-29 MED ORDER — LIDOCAINE HCL (PF) 1 % IJ SOLN: 5.0000 mL | Freq: Once | INTRAMUSCULAR | Status: DC

## 2019-10-29 MED ORDER — CEPHALEXIN 500 MG PO CAPS: 500.0000 mg | ORAL_CAPSULE | Freq: Two times a day (BID) | ORAL | 0 refills | Status: AC

## 2019-10-29 MED ORDER — CEPHALEXIN 500 MG PO CAPS
500.0000 mg | ORAL_CAPSULE | Freq: Once | ORAL | Status: AC
  Administered 2019-10-29: 500 mg via ORAL
  Filled 2019-10-29: qty 1

## 2019-10-29 NOTE — ED Notes (Signed)
Pt states Saturday he noticed a "small bump" to upper lip, states thought it was just acne. Monday he squeezed it and states it did drain some pus. States since the upper lip has become more swollen and painful. Today he took a knife and tried to cut off the scab and did drain some. Large amount of swelling noted to upper lip, states pain radiates up face.

## 2019-10-29 NOTE — ED Provider Notes (Signed)
[facility] Emergency Department Provider Note  ____________________________________________   First MD Initiated Contact with Patient 10/29/19 0206     (approximate)  I have reviewed the triage vital signs and the nursing notes.   HISTORY  Chief Complaint Oral Swelling  HPI Gabriel Bullock is a 44 y.o. male below list of previous medical conditions presents to the emergency department secondary to upper lip swelling and purulent drainage from a "bump" that the patient noticed yesterday.  Patient states that he initially attempted to squeeze purulent drainage from the bump and then subsequently caught it with a box cutter and stuck a heated needle in the area.  Patient states that he has had progressive swelling since doing so.  Patient denies any fever.  Patient states that current pain score is 7 out of 10.  No alleviating factors for his discomfort.  Patient states touching the area makes the discomfort worse.        History reviewed. No pertinent past medical history.  Patient Active Problem List   Diagnosis Date Noted  . Chickenpox 06/04/2016  . Abscess of right arm 05/28/2016  . Gross hematuria 03/19/2016  . 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 Renal cyst 03/19/2016  . Recurrent pain of right knee 08/26/2014  . Opioid use disorder, severe, dependence ([place]) 07/29/2014  . Synovitis of knee 04/06/2014  . S/P arthroscopy of left knee 01/14/2014  . Status post arthroscopy of right knee 11/27/2013    Past Surgical History:  Procedure Laterality Date  . INCISION AND DRAINAGE ABSCESS Right 05/28/2016   Procedure: INCISION AND DRAINAGE ABSCESS;  Surgeon: Olean Ree, MD;  Location: [facility] ORS;  Service: General;  Laterality: Right;    Prior to Admission medications   Medication Sig Start Date End Date Taking? Authorizing Provider  ibuprofen (ADVIL,MOTRIN) 600 MG tablet Take 1 tablet (600 mg total) by mouth every 8 (eight)  hours as needed for fever, mild pain or moderate pain. 05/30/16   Olean Ree, MD  traMADol (ULTRAM) 50 MG tablet Take 1 tablet (50 mg total) by mouth every 6 (six) hours as needed. 06/04/16   Clayburn Pert, MD    Allergies Patient has no known allergies.  No family history on file.  Social History Social History   Tobacco Use  . Smoking status: Current Every Day Smoker    Packs/day: 0.75  . Smokeless tobacco: Never Used  Substance Use Topics  . Alcohol use: No  . Drug use: No    Review of Systems Constitutional: No fever/chills Eyes: No visual changes. ENT: No sore throat.  Positive for upper lip swelling Cardiovascular: Denies chest pain. Respiratory: Denies shortness of breath. Gastrointestinal: No abdominal pain.  No nausea, no vomiting.  No diarrhea.  No constipation. Genitourinary: Negative for dysuria. Musculoskeletal: Negative for neck pain.  Negative for back pain. Integumentary: Negative for rash. Neurological: Negative for headaches, focal weakness or numbness.  ____________________________________________   PHYSICAL EXAM:  VITAL SIGNS: ED Triage Vitals  Enc Vitals Group     BP 10/28/19 2305 (!) 142/110     Pulse Rate 10/28/19 2305 99     Resp 10/28/19 2305 16     Temp 10/28/19 2305 99 F (37.2 C)     Temp Source 10/28/19 2305 Oral     SpO2 10/28/19 2305 98 %     Weight 10/28/19 2304 88.5 kg (195 lb)     Height 10/28/19 2304 1.905 m (6\' 3" )  Head Circumference --      Peak Flow --      Pain Score 10/28/19 2304 7     Pain Loc --      Pain Edu? --      Excl. in [place]? --     Constitutional: Alert and oriented.  Eyes: Conjunctivae are normal.  Mouth/Throat: Mid upper lip pustule with scant drainage.  Upper lip swelling Neck: No stridor.  No meningeal signs.   Cardiovascular: Normal rate, regular rhythm. Good peripheral circulation. Grossly normal heart sounds. Respiratory: Normal respiratory effort.  No retractions. Gastrointestinal: Soft  and nontender. No distention.   Musculoskeletal: No lower extremity tenderness nor edema. No gross deformities of extremities. Neurologic:  Normal speech and language. No gross focal neurologic deficits are appreciated.  Skin: Please refer to mouth exam Psychiatric: Mood and affect are normal. Speech and behavior are normal.   ..Incision and Drainage  Date/Time: 10/29/2019 3:54 AM Performed by: Gregor Hams, MD Authorized by: Gregor Hams, MD   Consent:    Consent obtained:  Verbal   Consent given by:  Patient   Risks discussed:  Bleeding, infection, incomplete drainage and pain   Alternatives discussed:  Alternative treatment, delayed treatment and observation Location:    Type:  Abscess   Location:  Neck (Upper lip) Anesthesia (see MAR for exact dosages):    Anesthesia method:  Local infiltration   Local anesthetic:  Lidocaine 1% w/o epi Procedure type:    Complexity:  Complex Procedure details:    Incision types:  Stab incision   Scalpel blade:  11   Drainage:  Purulent and bloody Post-procedure details:    Patient tolerance of procedure:  Tolerated well, no immediate complications     ____________________________________________   INITIAL IMPRESSION / MDM / ASSESSMENT AND PLAN / ED COURSE  As part of my medical decision making, I reviewed the following data within the electronic MEDICAL RECORD NUMBER   44 year old male presented with above-stated history and physical exam secondary to upper lip swelling with differential diagnosis including but not limited to folliculitis abscess cellulitis.  Clinical findings consistent with an abscess.  I&D was performed with purulent drainage expressed.  Patient given Keflex in the emergency department will be prescribed the same for home. ____________________________________________  FINAL CLINICAL IMPRESSION(S) / ED DIAGNOSES  Final diagnoses:  Lip abscess     MEDICATIONS GIVEN DURING THIS VISIT:  Medications    lidocaine (PF) (XYLOCAINE) 1 % injection 5 mL (has no administration in time range)  cephALEXin (KEFLEX) capsule 500 mg (500 mg Oral Given 10/29/19 0230)     ED Discharge Orders    None      *Please note:  Gabriel Bullock was evaluated in Emergency Department on 10/29/2019 for the symptoms described in the history of present illness. He was evaluated in the context of the global COVID-19 pandemic, which necessitated consideration that the patient might be at risk for infection with the SARS-CoV-2 virus that causes COVID-19. Institutional protocols and algorithms that pertain to the evaluation of patients at risk for COVID-19 are in a state of rapid change based on information released by regulatory bodies including the CDC and federal and state organizations. These policies and algorithms were followed during the patient's care in the ED.  Some ED evaluations and interventions may be delayed as a result of limited staffing during the pandemic.*  Note:  This document was prepared using Dragon voice recognition software and may include unintentional dictation errors.  Gregor Hams, MD 10/29/19 [phone]

## 2019-10-29 NOTE — ED Notes (Signed)
Patient discharged to home per MD order. Patient in stable condition, and deemed medically cleared by ED provider for discharge. Discharge instructions reviewed with patient/family using "Teach Back"; verbalized understanding of medication education and administration, and information about follow-up care. Denies further concerns. ° °

## 2019-10-29 NOTE — ED Notes (Signed)
Abscess drained by EDP, pt tolerated well.

## 2020-02-08 ENCOUNTER — Ambulatory Visit: Payer: Medicaid Other | Attending: Critical Care Medicine

## 2020-02-08 DIAGNOSIS — Z23 Encounter for immunization: Secondary | ICD-10-CM

## 2020-02-08 NOTE — Progress Notes (Signed)
   Covid-19 Vaccination Clinic  Name:  Gabriel Bullock    MRN: [id number] DOB: 02-18-1976  02/08/2020  [name] was observed post Covid-19 immunization for 15 minutes without incident. He was provided with Vaccine Information Sheet and instruction to access the V-Safe system.   [name] was instructed to call 911 with any severe reactions post vaccine: Marland Kitchen Difficulty breathing  . Swelling of face and throat  . A fast heartbeat  . A bad rash all over body  . Dizziness and weakness   Immunizations Administered    Name Date Dose VIS Date Route   Pfizer COVID-19 Vaccine 02/08/2020  2:31 PM 0.3 mL 08/26/2018 Intramuscular   Manufacturer: [place]   Lot: [id number]   [place]: [id number]

## 2020-02-29 ENCOUNTER — Ambulatory Visit: Payer: Self-pay

## 2022-05-04 ENCOUNTER — Ambulatory Visit: Payer: Self-pay | Admitting: Internal Medicine

## 2022-05-21 NOTE — Progress Notes (Deleted)
There were no vitals taken for this visit.   Subjective:    Patient ID: Gabriel Bullock, male    DOB: 01-14-1976, 46 y.o.   MRN: [id number]  HPI: Gabriel Bullock is a 46 y.o. male  No chief complaint on file.  Establish care:  His last physical was ***.  He has a history of ***.  Family history includes ; ***.  Relevant past medical, surgical, family and social history reviewed and updated as indicated. Interim medical history since our last visit reviewed. Allergies and medications reviewed and updated.  Review of Systems  Per HPI unless specifically indicated above     Objective:    There were no vitals taken for this visit.  Wt Readings from Last 3 Encounters:  10/28/19 195 lb (88.5 kg)  06/04/16 180 lb (81.6 kg)  05/28/16 175 lb (79.4 kg)    Physical Exam  Results for orders placed or performed during the hospital encounter of 05/28/16  Blood culture (routine x 2)   Specimen: BLOOD  Result Value Ref Range   Specimen Description BLOOD RIGHT AC    Special Requests BOTTLES DRAWN AEROBIC AND ANAEROBIC 6CC    Culture NO GROWTH 5 DAYS    Report Status 06/02/2016 FINAL   Blood culture (routine x 2)   Specimen: BLOOD  Result Value Ref Range   Specimen Description BLOOD RIGHT FOREARM    Special Requests BOTTLES DRAWN AEROBIC AND ANAEROBIC 8CC    Culture NO GROWTH 5 DAYS    Report Status 06/02/2016 FINAL   Aerobic/Anaerobic Culture (surgical/deep wound)   Specimen: PATH Other; Tissue  Result Value Ref Range   Specimen Description ARM    Special Requests NONE    Gram Stain      RARE WBC PRESENT,BOTH PMN AND MONONUCLEAR RARE GRAM POSITIVE COCCI IN PAIRS    Culture      FEW EIKENELLA CORRODENS Usually susceptible to penicillin and other beta lactam agents,quinolones,macrolides and tetracyclines. FEW PREVOTELLA MELANINOGENICA BETA LACTAMASE POSITIVE Performed at [facility]    Report Status 06/04/2016 FINAL   Lactic acid, plasma  Result Value Ref Range    Lactic Acid, Venous 1.1 0.5 - 1.9 mmol/L  Urine Drug Screen, Qualitative ([facility] only)  Result Value Ref Range   Tricyclic, Ur Screen NONE DETECTED NONE DETECTED   Amphetamines, Ur Screen NONE DETECTED NONE DETECTED   MDMA (Ecstasy)Ur Screen NONE DETECTED NONE DETECTED   Cocaine Metabolite,Ur [place] POSITIVE (A) NONE DETECTED   Opiate, Ur Screen NONE DETECTED NONE DETECTED   Phencyclidine (PCP) Ur S NONE DETECTED NONE DETECTED   Cannabinoid 50 Ng, Ur [place] NONE DETECTED NONE DETECTED   Barbiturates, Ur Screen NONE DETECTED NONE DETECTED   Benzodiazepine, Ur Scrn POSITIVE (A) NONE DETECTED   Methadone Scn, Ur NONE DETECTED NONE DETECTED  Basic metabolic panel  Result Value Ref Range   Sodium 134 (L) 135 - 145 mmol/L   Potassium 3.8 3.5 - 5.1 mmol/L   Chloride 102 101 - 111 mmol/L   CO2 26 22 - 32 mmol/L   Glucose, Bld 108 (H) 65 - 99 mg/dL   BUN 15 6 - 20 mg/dL   Creatinine, Ser 0.84 0.61 - 1.24 mg/dL   Calcium 8.6 (L) 8.9 - 10.3 mg/dL   GFR calc non Af Amer >60 >60 mL/min   GFR calc Af Amer >60 >60 mL/min   Anion gap 6 5 - 15  Magnesium  Result Value Ref Range   Magnesium 1.9 1.7 -  2.4 mg/dL  CBC  Result Value Ref Range   WBC 15.4 (H) 3.8 - 10.6 K/uL   RBC 4.63 4.40 - 5.90 MIL/uL   Hemoglobin 13.9 13.0 - 18.0 g/dL   HCT 40.8 40.0 - 52.0 %   MCV 88.2 80.0 - 100.0 fL   MCH 30.1 26.0 - 34.0 pg   MCHC 34.1 32.0 - 36.0 g/dL   RDW 13.1 11.5 - 14.5 %   Platelets 162 150 - 440 K/uL  CBC with Differential/Platelet  Result Value Ref Range   WBC 8.0 3.8 - 10.6 K/uL   RBC 4.33 (L) 4.40 - 5.90 MIL/uL   Hemoglobin 13.1 13.0 - 18.0 g/dL   HCT 38.5 (L) 40.0 - 52.0 %   MCV 88.9 80.0 - 100.0 fL   MCH 30.1 26.0 - 34.0 pg   MCHC 33.9 32.0 - 36.0 g/dL   RDW 13.4 11.5 - 14.5 %   Platelets 150 150 - 440 K/uL   Neutrophils Relative % 72 %   Neutro Abs 5.8 1.4 - 6.5 K/uL   Lymphocytes Relative 17 %   Lymphs Abs 1.3 1.0 - 3.6 K/uL   Monocytes Relative 7 %   Monocytes Absolute 0.5 0.2 - 1.0 K/uL    Eosinophils Relative 3 %   Eosinophils Absolute 0.2 0 - 0.7 K/uL   Basophils Relative 1 %   Basophils Absolute 0.1 0 - 0.1 K/uL  Vancomycin, trough  Result Value Ref Range   Vancomycin Tr 18 15 - 20 ug/mL      Assessment & Plan:   Problem List Items Addressed This Visit   None    Follow up plan: No follow-ups on file.

## 2022-05-22 ENCOUNTER — Ambulatory Visit: Payer: Medicaid Other | Admitting: Nurse Practitioner
